# Patient Record
Sex: Female | Born: 1975 | Race: White | Hispanic: No | Marital: Single | Smoking: Current every day smoker
Health system: Southern US, Community
[De-identification: ages and names within clinical notes are randomized; demographics above are authoritative.]

## PROBLEM LIST (undated history)

## (undated) DIAGNOSIS — R569 Unspecified convulsions: Secondary | ICD-10-CM

---

## 2014-06-29 ENCOUNTER — Emergency Department (HOSPITAL_COMMUNITY): Payer: Self-pay

## 2014-06-29 ENCOUNTER — Encounter (HOSPITAL_COMMUNITY): Payer: Self-pay | Admitting: Emergency Medicine

## 2014-06-29 ENCOUNTER — Observation Stay (HOSPITAL_COMMUNITY)
Admission: EM | Admit: 2014-06-29 | Discharge: 2014-07-01 | Disposition: A | Discharge status: Home / Self Care | Payer: Self-pay | Attending: Internal Medicine | Admitting: Internal Medicine

## 2014-06-29 DIAGNOSIS — E8729 Other acidosis: Secondary | ICD-10-CM | POA: Diagnosis present

## 2014-06-29 DIAGNOSIS — E43 Unspecified severe protein-calorie malnutrition: Secondary | ICD-10-CM | POA: Insufficient documentation

## 2014-06-29 DIAGNOSIS — Z6828 Body mass index (BMI) 28.0-28.9, adult: Secondary | ICD-10-CM | POA: Insufficient documentation

## 2014-06-29 DIAGNOSIS — R1114 Bilious vomiting: Secondary | ICD-10-CM

## 2014-06-29 DIAGNOSIS — F17200 Nicotine dependence, unspecified, uncomplicated: Secondary | ICD-10-CM | POA: Insufficient documentation

## 2014-06-29 DIAGNOSIS — E872 Acidosis, unspecified: Principal | ICD-10-CM | POA: Insufficient documentation

## 2014-06-29 DIAGNOSIS — R112 Nausea with vomiting, unspecified: Secondary | ICD-10-CM | POA: Diagnosis present

## 2014-06-29 DIAGNOSIS — T522X1A Toxic effect of homologues of benzene, accidental (unintentional), initial encounter: Secondary | ICD-10-CM

## 2014-06-29 DIAGNOSIS — G40909 Epilepsy, unspecified, not intractable, without status epilepticus: Secondary | ICD-10-CM | POA: Diagnosis present

## 2014-06-29 HISTORY — DX: Unspecified convulsions: R56.9

## 2014-06-29 LAB — CBC WITH DIFFERENTIAL/PLATELET
Basophils Absolute: 0 10*3/uL (ref 0.0–0.1)
Basophils Relative: 0 % (ref 0–1)
Eosinophils Absolute: 0 10*3/uL (ref 0.0–0.7)
Eosinophils Relative: 0 % (ref 0–5)
HCT: 43 % (ref 36.0–46.0)
HEMOGLOBIN: 15 g/dL (ref 12.0–15.0)
Lymphocytes Relative: 29 % (ref 12–46)
Lymphs Abs: 2.6 10*3/uL (ref 0.7–4.0)
MCH: 29.9 pg (ref 26.0–34.0)
MCHC: 34.9 g/dL (ref 30.0–36.0)
MCV: 85.8 fL (ref 78.0–100.0)
MONOS PCT: 8 % (ref 3–12)
Monocytes Absolute: 0.7 10*3/uL (ref 0.1–1.0)
NEUTROS ABS: 5.6 10*3/uL (ref 1.7–7.7)
NEUTROS PCT: 63 % (ref 43–77)
Platelets: 308 10*3/uL (ref 150–400)
RBC: 5.01 MIL/uL (ref 3.87–5.11)
RDW: 13.7 % (ref 11.5–15.5)
WBC: 8.9 10*3/uL (ref 4.0–10.5)

## 2014-06-29 LAB — URINALYSIS, ROUTINE W REFLEX MICROSCOPIC
BILIRUBIN URINE: NEGATIVE
Glucose, UA: NEGATIVE mg/dL
Ketones, ur: NEGATIVE mg/dL
NITRITE: NEGATIVE
PH: 6 (ref 5.0–8.0)
Protein, ur: 30 mg/dL — AB
Specific Gravity, Urine: 1.024 (ref 1.005–1.030)
UROBILINOGEN UA: 0.2 mg/dL (ref 0.0–1.0)

## 2014-06-29 LAB — COMPREHENSIVE METABOLIC PANEL
ALK PHOS: 73 U/L (ref 39–117)
ALT: 14 U/L (ref 0–35)
ANION GAP: 19 — AB (ref 5–15)
AST: 12 U/L (ref 0–37)
Albumin: 4.3 g/dL (ref 3.5–5.2)
BUN: 7 mg/dL (ref 6–23)
CHLORIDE: 108 meq/L (ref 96–112)
CO2: 12 mEq/L — ABNORMAL LOW (ref 19–32)
Calcium: 9.4 mg/dL (ref 8.4–10.5)
Creatinine, Ser: 0.72 mg/dL (ref 0.50–1.10)
GFR calc non Af Amer: >90 mL/min (ref >90)
GLUCOSE: 121 mg/dL — AB (ref 70–99)
POTASSIUM: 3.9 meq/L (ref 3.7–5.3)
Sodium: 139 mEq/L (ref 137–147)
Total Protein: 8.3 g/dL (ref 6.0–8.3)

## 2014-06-29 LAB — I-STAT ARTERIAL BLOOD GAS, ED
ACID-BASE DEFICIT: 13 mmol/L — AB (ref 0.0–2.0)
Bicarbonate: 13.4 mEq/L — ABNORMAL LOW (ref 20.0–24.0)
O2 SAT: 96 %
PCO2 ART: 30.9 mmHg — AB (ref 35.0–45.0)
TCO2: 14 mmol/L (ref 0–100)
pH, Arterial: 7.246 — ABNORMAL LOW (ref 7.350–7.450)
pO2, Arterial: 96 mmHg (ref 80.0–100.0)

## 2014-06-29 LAB — LIPASE, BLOOD: Lipase: 34 U/L (ref 11–59)

## 2014-06-29 LAB — URINE MICROSCOPIC-ADD ON

## 2014-06-29 LAB — CBG MONITORING, ED: Glucose-Capillary: 104 mg/dL — ABNORMAL HIGH (ref 70–99)

## 2014-06-29 LAB — I-STAT CHEM 8, ED
BUN: 8 mg/dL (ref 6–23)
CALCIUM ION: 1.28 mmol/L — AB (ref 1.12–1.23)
CREATININE: 0.8 mg/dL (ref 0.50–1.10)
Chloride: 113 mEq/L — ABNORMAL HIGH (ref 96–112)
Glucose, Bld: 122 mg/dL — ABNORMAL HIGH (ref 70–99)
HCT: 49 % — ABNORMAL HIGH (ref 36.0–46.0)
HEMOGLOBIN: 16.7 g/dL — AB (ref 12.0–15.0)
Potassium: 3.5 mEq/L — ABNORMAL LOW (ref 3.7–5.3)
SODIUM: 137 meq/L (ref 137–147)
TCO2: 14 mmol/L (ref 0–100)

## 2014-06-29 LAB — POC URINE PREG, ED: Preg Test, Ur: NEGATIVE

## 2014-06-29 LAB — I-STAT CG4 LACTIC ACID, ED: Lactic Acid, Venous: 0.57 mmol/L (ref 0.5–2.2)

## 2014-06-29 MED ORDER — MORPHINE SULFATE 4 MG/ML IJ SOLN
4.0000 mg | Freq: Once | INTRAMUSCULAR | Status: AC
  Administered 2014-06-29: 4 mg via INTRAVENOUS
  Filled 2014-06-29: qty 1

## 2014-06-29 MED ORDER — PROMETHAZINE HCL 25 MG PO TABS: 25.0000 mg | ORAL_TABLET | Freq: Four times a day (QID) | ORAL | Status: AC | PRN

## 2014-06-29 MED ORDER — LORAZEPAM 2 MG/ML IJ SOLN
1.0000 mg | Freq: Once | INTRAMUSCULAR | Status: AC
  Administered 2014-06-29: 1 mg via INTRAVENOUS
  Filled 2014-06-29: qty 1

## 2014-06-29 MED ORDER — SODIUM CHLORIDE 0.9 % IV BOLUS (SEPSIS)
1000.0000 mL | Freq: Once | INTRAVENOUS | Status: AC
  Administered 2014-06-30: 1000 mL via INTRAVENOUS

## 2014-06-29 MED ORDER — ONDANSETRON 4 MG PO TBDP
8.0000 mg | ORAL_TABLET | Freq: Once | ORAL | Status: AC
  Administered 2014-06-29: 8 mg via ORAL
  Filled 2014-06-29: qty 2

## 2014-06-29 MED ORDER — CARBAMAZEPINE 200 MG PO TABS: 200.0000 mg | ORAL_TABLET | Freq: Two times a day (BID) | ORAL | Status: AC

## 2014-06-29 MED ORDER — IOHEXOL 300 MG/ML  SOLN
100.0000 mL | Freq: Once | INTRAMUSCULAR | Status: AC | PRN
  Administered 2014-06-29: 100 mL via INTRAVENOUS

## 2014-06-29 MED ORDER — DICYCLOMINE HCL 20 MG PO TABS: 20.0000 mg | ORAL_TABLET | Freq: Two times a day (BID) | ORAL | Status: AC

## 2014-06-29 MED ORDER — SODIUM CHLORIDE 0.9 % IV BOLUS (SEPSIS)
1000.0000 mL | Freq: Once | INTRAVENOUS | Status: AC
  Administered 2014-06-29: 1000 mL via INTRAVENOUS

## 2014-06-29 MED ORDER — ZOLPIDEM TARTRATE 5 MG PO TABS
5.0000 mg | ORAL_TABLET | Freq: Every evening | ORAL | Status: DC | PRN
  Administered 2014-06-30 (×2): 5 mg via ORAL
  Filled 2014-06-29 (×2): qty 1

## 2014-06-29 NOTE — ED Notes (Signed)
Pt in with family stating that patient has been having increased seizures over the last week, pt with history of seizures but does not normally have "violent ones", family reports patient has been seen at multiple hospitals local to them in the last few days but they are unhappy with results so they traveled here. Family also reports that patient has been forgetful recently also. Also c/o n/v recently. Pt has been off of her dilantin for the last few months due to lack of insurance, states she received an IV dose two nights ago but has had a seizure again since that time. Most recent seizure was last night. Pt c/o tongue pain and swelling from oral trauma earlier in the week. Pt alert and oriented during triage.

## 2014-06-29 NOTE — ED Notes (Signed)
MD at bedside. 

## 2014-06-29 NOTE — ED Notes (Signed)
CT notified pt done with contrast. 

## 2014-06-29 NOTE — Discharge Instructions (Signed)
Please follow up as soon as possible with the clinic in [facility]. You will need to have your tegretol level and you sodium level checked. Please go to the health department or to the ED if you are unable to access care.  Abdominal (belly) pain can be caused by many things. Your caregiver performed an examination and possibly ordered blood/urine tests and imaging (CT scan, x-rays, ultrasound). Many cases can be observed and treated at home after initial evaluation in the emergency department. Even though you are being discharged home, abdominal pain can be unpredictable. Therefore, you need a repeated exam if your pain does not resolve, returns, or worsens. Most patients with abdominal pain don't have to be admitted to the hospital or have surgery, but serious problems like appendicitis and gallbladder attacks can start out as nonspecific pain. Many abdominal conditions cannot be diagnosed in one visit, so follow-up evaluations are very important. SEEK IMMEDIATE MEDICAL ATTENTION IF: The pain does not go away or becomes severe.  A temperature above 101 develops.  Repeated vomiting occurs (multiple episodes).  The pain becomes localized to portions of the abdomen. The right side could possibly be appendicitis. In an adult, the left lower portion of the abdomen could be colitis or diverticulitis.  Blood is being passed in stools or vomit (bright red or black tarry stools).  Return also if you develop chest pain, difficulty breathing, dizziness or fainting, or become confused, poorly responsive, or inconsolable (young children).   Driving and Equipment Restrictions Some medical problems make it dangerous to drive, ride a bike, or use machines. Some of these problems are:  A hard blow to the head (concussion).  Passing out (fainting).  Twitching and shaking (seizures).  Low blood sugar.  Taking medicine to help you relax (sedatives).  Taking pain medicines.  Wearing an eye  patch.  Wearing splints. This can make it hard to use parts of your body that you need to drive safely. HOME CARE   Do not drive until your doctor says it is okay.  Do not use machines until your doctor says it is okay. You may need a form signed by your doctor (medical release) before you can drive again. You may also need this form before you do other tasks where you need to be fully alert. MAKE SURE YOU:  Understand these instructions.  Will watch your condition.  Will get help right away if you are not doing well or get worse. Document Released: 01/01/2005 Document Revised: 02/16/2012 Document Reviewed: 04/03/2010 [facility] Patient Information 2015 [place], [place]. This information is not intended to replace advice given to you by your health care provider. Make sure you discuss any questions you have with your health care provider.  Epilepsy Epilepsy is a disorder in which a person has repeated seizures over time. A seizure is a release of abnormal electrical activity in the brain. Seizures can cause a change in attention, behavior, or the ability to remain awake and alert (altered mental status). Seizures often involve uncontrollable shaking (convulsions).  Most people with epilepsy lead normal lives. However, people with epilepsy are at an increased risk of falls, accidents, and injuries. Therefore, it is important to begin treatment right away. CAUSES  Epilepsy has many possible causes. Anything that disturbs the normal pattern of brain cell activity can lead to seizures. This may include:   Head injury.  Birth trauma.  High fever as a child.  Stroke.  Bleeding into or around the brain.  Certain drugs.  Prolonged low oxygen, such as what occurs after CPR efforts.  Abnormal brain development.  Certain illnesses, such as meningitis, encephalitis (brain infection), malaria, and other infections.  An imbalance of nerve signaling chemicals (neurotransmitters).  SIGNS  AND SYMPTOMS  The symptoms of a seizure can vary greatly from one person to another. Right before a seizure, you may have a warning (aura) that a seizure is about to occur. An aura may include the following symptoms:  Fear or anxiety.  Nausea.  Feeling like the room is spinning (vertigo).  Vision changes, such as seeing flashing lights or spots. Common symptoms during a seizure include:  Abnormal sensations, such as an abnormal smell or a bitter taste in the mouth.   Sudden, general body stiffness.   Convulsions that involve rhythmic jerking of the face, arm, or leg on one or both sides.   Sudden change in consciousness.   Appearing to be awake but not responding.   Appearing to be asleep but cannot be awakened.   Grimacing, chewing, lip smacking, drooling, tongue biting, or loss of bowel or bladder control. After a seizure, you may feel sleepy for a while. DIAGNOSIS  Your health care provider will ask about your symptoms and take a medical history. Descriptions from any witnesses to your seizures will be very helpful in the diagnosis. A physical exam, including a detailed neurological exam, is necessary. Various tests may be done, such as:   An electroencephalogram (EEG). This is a painless test of your brain waves. In this test, a diagram is created of your brain waves. These diagrams can be interpreted by a specialist.  An MRI of the brain.   A CT scan of the brain.   A spinal tap (lumbar puncture, LP).  Blood tests to check for signs of infection or abnormal blood chemistry. TREATMENT  There is no cure for epilepsy, but it is generally treatable. Once epilepsy is diagnosed, it is important to begin treatment as soon as possible. For most people with epilepsy, seizures can be controlled with medicines. The following may also be used:  A pacemaker for the brain (vagus nerve stimulator) can be used for people with seizures that are not well controlled by  medicine.  Surgery on the brain. For some people, epilepsy eventually goes away. HOME CARE INSTRUCTIONS   Follow your health care provider's recommendations on driving and safety in normal activities.  Get enough rest. Lack of sleep can cause seizures.  Only take over-the-counter or prescription medicines as directed by your health care provider. Take any prescribed medicine exactly as directed.  Avoid any known triggers of your seizures.  Keep a seizure diary. Record what you recall about any seizure, especially any possible trigger.   Make sure the people you live and work with know that you are prone to seizures. They should receive instructions on how to help you. In general, a witness to a seizure should:   Cushion your head and body.   Turn you on your side.   Avoid unnecessarily restraining you.   Not place anything inside your mouth.   Call for emergency medical help if there is any question about what has occurred.   Follow up with your health care provider as directed. You may need regular blood tests to monitor the levels of your medicine.  SEEK MEDICAL CARE IF:   You develop signs of infection or other illness. This might increase the risk of a seizure.   You seem to be having more  frequent seizures.   Your seizure pattern is changing.  SEEK IMMEDIATE MEDICAL CARE IF:   You have a seizure that does not stop after a few moments.   You have a seizure that causes any difficulty in breathing.   You have a seizure that results in a very severe headache.   You have a seizure that leaves you with the inability to speak or use a part of your body.  Document Released: 11/24/2005 Document Revised: 09/14/2013 Document Reviewed: 07/06/2013 [facility] Patient Information 2015 [place], [place]. This information is not intended to replace advice given to you by your health care provider. Make sure you discuss any questions you have with your health care  provider.  Nausea and Vomiting Nausea is a sick feeling that often comes before throwing up (vomiting). Vomiting is a reflex where stomach contents come out of your mouth. Vomiting can cause severe loss of body fluids (dehydration). Children and elderly adults can become dehydrated quickly, especially if they also have diarrhea. Nausea and vomiting are symptoms of a condition or disease. It is important to find the cause of your symptoms. CAUSES   Direct irritation of the stomach lining. This irritation can result from increased acid production (gastroesophageal reflux disease), infection, food poisoning, taking certain medicines (such as nonsteroidal anti-inflammatory drugs), alcohol use, or tobacco use.  Signals from the brain.These signals could be caused by a headache, heat exposure, an inner ear disturbance, increased pressure in the brain from injury, infection, a tumor, or a concussion, pain, emotional stimulus, or metabolic problems.  An obstruction in the gastrointestinal tract (bowel obstruction).  Illnesses such as diabetes, hepatitis, gallbladder problems, appendicitis, kidney problems, cancer, sepsis, atypical symptoms of a heart attack, or eating disorders.  Medical treatments such as chemotherapy and radiation.  Receiving medicine that makes you sleep (general anesthetic) during surgery. DIAGNOSIS Your caregiver may ask for tests to be done if the problems do not improve after a few days. Tests may also be done if symptoms are severe or if the reason for the nausea and vomiting is not clear. Tests may include:  Urine tests.  Blood tests.  Stool tests.  Cultures (to look for evidence of infection).  X-rays or other imaging studies. Test results can help your caregiver make decisions about treatment or the need for additional tests. TREATMENT You need to stay well hydrated. Drink frequently but in small amounts.You may wish to drink water, sports drinks, clear broth, or  eat frozen ice pops or gelatin dessert to help stay hydrated.When you eat, eating slowly may help prevent nausea.There are also some antinausea medicines that may help prevent nausea. HOME CARE INSTRUCTIONS   Take all medicine as directed by your caregiver.  If you do not have an appetite, do not force yourself to eat. However, you must continue to drink fluids.  If you have an appetite, eat a normal diet unless your caregiver tells you differently.  Eat a variety of complex carbohydrates (rice, wheat, potatoes, bread), lean meats, yogurt, fruits, and vegetables.  Avoid high-fat foods because they are more difficult to digest.  Drink enough water and fluids to keep your urine clear or pale yellow.  If you are dehydrated, ask your caregiver for specific rehydration instructions. Signs of dehydration may include:  Severe thirst.  Dry lips and mouth.  Dizziness.  Dark urine.  Decreasing urine frequency and amount.  Confusion.  Rapid breathing or pulse. SEEK IMMEDIATE MEDICAL CARE IF:   You have blood or brown flecks (like  coffee grounds) in your vomit.  You have black or bloody stools.  You have a severe headache or stiff neck.  You are confused.  You have severe abdominal pain.  You have chest pain or trouble breathing.  You do not urinate at least once every 8 hours.  You develop cold or clammy skin.  You continue to vomit for longer than 24 to 48 hours.  You have a fever. MAKE SURE YOU:   Understand these instructions.  Will watch your condition.  Will get help right away if you are not doing well or get worse. Document Released: 11/24/2005 Document Revised: 02/16/2012 Document Reviewed: 04/23/2011 [facility] Patient Information 2015 [place], [place]. This information is not intended to replace advice given to you by your health care provider. Make sure you discuss any questions you have with your health care provider.

## 2014-06-29 NOTE — ED Provider Notes (Signed)
CSN: [id number]     Arrival date & time 06/29/14  1616 History   First MD Initiated Contact with Patient 06/29/14 1621     Chief Complaint  Patient presents with  . Seizures     (Consider location/radiation/quality/duration/timing/severity/associated sxs/prior Treatment) HPI April Lowe Is a 38 year old female with a past medical history of epilepsy who presents to the emergency department with multiple complaints. Patient was seen 2 days ago the hospital and mortality. She states she was not pleased with her care there. The patient states that she has been on Dilantin for her seizures and that she has no neurologic outpatient followup. She is always got her medications from the hospital. She is unable to afford Dilantin at this time and has not been taking any of her medications. She has had worsening grand mal seizures that have increased in both intensity, duration, and frequency. The patient is frustrated because she is unable to obtain the medications. She is also concerned because she is a younger sister with severe epilepsy going to have some sort of brain surgery secondary to her worsening epileptic seizures. The patient also complains of abdominal pain, nausea and vomiting for the past 2 weeks has been unable to hold down any significant amount of fluid. She cannot eat solid foods. She has been vomiting his bright green vomitus as well as bright creams as obliquity. The patient has had what she thinks is a 15 pound weight loss in the past 2 weeks and has had to move her wedding ring to her middle finger because of her weight loss. She has a complaint of urgency to urinate, frequency of urination, some dysuria as well as lower quadrant abdominal pain on the left side. Denies fevers, chills, myalgias, arthralgias. Denies DOE, SOB, chest tightness or pressure, radiation to left arm, jaw or back, or diaphoresis.   Past Medical History  Diagnosis Date  . Seizures    History reviewed. No  pertinent past surgical history. History reviewed. No pertinent family history. History  Substance Use Topics  . Smoking status: Current Every Day Smoker  . Smokeless tobacco: Not on file  . Alcohol Use: Not on file   OB History   Grav Para Term Preterm Abortions TAB SAB Ect Mult Living                 Review of Systems  Ten systems reviewed and are negative for acute change, except as noted in the HPI.    Allergies  Doxycycline; Toradol; and Penicillins  Home Medications   Prior to Admission medications   Medication Sig Start Date End Date Taking? Authorizing Provider  phenytoin (DILANTIN) 100 MG ER capsule Take 100 mg by mouth 3 (three) times daily.   Yes Historical Provider, MD   BP 131/83  Pulse 100  Temp(Src) 97.4 F (36.3 C) (Oral)  Resp 23  SpO2 99% Physical Exam  Nursing note and vitals reviewed. Constitutional: She is oriented to person, place, and time.  Pale, dry oral mucosa, deep cracked angular chelitis  HENT:  Head: Normocephalic and atraumatic.  Bite marks present left lateral tongue  Eyes: Conjunctivae and EOM are normal. Pupils are equal, round, and reactive to light.  Neck: Normal range of motion.  Cardiovascular: Normal rate and regular rhythm.   Pulmonary/Chest: Effort normal and breath sounds normal.  Acetone smell to breath  Abdominal: Soft. Bowel sounds are normal. She exhibits no distension. There is tenderness (BL lower quadrants. worse on the left).  Neurological: She is  alert and oriented to person, place, and time.  Skin: Skin is warm.    ED Course  Procedures (including critical care time) Labs Review Labs Reviewed  CLOSTRIDIUM DIFFICILE BY PCR  CBC WITH DIFFERENTIAL  COMPREHENSIVE METABOLIC PANEL  URINALYSIS, ROUTINE W REFLEX MICROSCOPIC  LIPASE, BLOOD  CBG MONITORING, ED  I-STAT CG4 LACTIC ACID, ED    Imaging Review No results found.   EKG Interpretation None      MDM   Final diagnoses:  None   6:04 PM BP  131/83  Pulse 100  Temp(Src) 97.4 F (36.3 C) (Oral)  Resp 23  SpO2 99% Patient here with multiple complaints. She is borderline tachycardic, pressure is normal without a fever. The patient appears to be malnourished with deep angular colitis. Question if this is due to her reported vomiting versus some other kind of folate and B12 deficiency. I suspect that her seizures are due to new lack of ability to afford her medications. As discussed our workup here in the emergency department include abdominal pain. Was consult within the posterior to see about starting on on an affordable form of fever meds.  Patient / Family / Caregiver understand and agree with initial ED impression and plan with expectations set for ED visit.     8:56 PM Patient awaiting CT scan. I have spoken with Dr. Amada JupiterKirkpatrick who advises tegretol 200 mg bid, however will need close monitoring of her levels and serum sodium. I have discussed this with the patient.\ No leukocytosis, she does have metabolic acidosis and low serum bicarb, I question if this si form her diarrhea and vomiting. Patient is currently being rehydrated.  [place] has [place] an [place].  i have givent report to PA Dammen who will assume care.  Arthor CaptainAbigail Edi Gorniak, PA-C 06/30/14 1104

## 2014-06-29 NOTE — ED Notes (Signed)
Pt finished with contrast.

## 2014-06-30 ENCOUNTER — Encounter (HOSPITAL_COMMUNITY): Payer: Self-pay | Admitting: Emergency Medicine

## 2014-06-30 DIAGNOSIS — E8729 Other acidosis: Secondary | ICD-10-CM | POA: Diagnosis present

## 2014-06-30 DIAGNOSIS — T522X1A Toxic effect of homologues of benzene, accidental (unintentional), initial encounter: Secondary | ICD-10-CM

## 2014-06-30 DIAGNOSIS — E872 Acidosis, unspecified: Secondary | ICD-10-CM

## 2014-06-30 DIAGNOSIS — E43 Unspecified severe protein-calorie malnutrition: Secondary | ICD-10-CM | POA: Insufficient documentation

## 2014-06-30 DIAGNOSIS — R112 Nausea with vomiting, unspecified: Secondary | ICD-10-CM | POA: Diagnosis present

## 2014-06-30 DIAGNOSIS — R1114 Bilious vomiting: Secondary | ICD-10-CM

## 2014-06-30 DIAGNOSIS — G40909 Epilepsy, unspecified, not intractable, without status epilepticus: Secondary | ICD-10-CM

## 2014-06-30 DIAGNOSIS — T5291XA Toxic effect of unspecified organic solvent, accidental (unintentional), initial encounter: Secondary | ICD-10-CM

## 2014-06-30 DIAGNOSIS — T522X4A Toxic effect of homologues of benzene, undetermined, initial encounter: Secondary | ICD-10-CM

## 2014-06-30 LAB — BASIC METABOLIC PANEL
Anion gap: 14 (ref 5–15)
BUN: 10 mg/dL (ref 6–23)
CO2: 14 mEq/L — ABNORMAL LOW (ref 19–32)
CREATININE: 0.69 mg/dL (ref 0.50–1.10)
Calcium: 8.4 mg/dL (ref 8.4–10.5)
Chloride: 113 mEq/L — ABNORMAL HIGH (ref 96–112)
GFR calc Af Amer: >90 mL/min (ref >90)
GLUCOSE: 101 mg/dL — AB (ref 70–99)
POTASSIUM: 3.9 meq/L (ref 3.7–5.3)
Sodium: 141 mEq/L (ref 137–147)

## 2014-06-30 LAB — SALICYLATE LEVEL: Salicylate Lvl: <2 mg/dL — ABNORMAL LOW (ref 2.8–20.0)

## 2014-06-30 LAB — CK: Total CK: 104 U/L (ref 7–177)

## 2014-06-30 MED ORDER — DICYCLOMINE HCL 10 MG PO CAPS
10.0000 mg | ORAL_CAPSULE | Freq: Three times a day (TID) | ORAL | Status: DC | PRN
  Administered 2014-06-30 – 2014-07-01 (×2): 10 mg via ORAL
  Filled 2014-06-30 (×4): qty 1

## 2014-06-30 MED ORDER — OXYCODONE HCL 5 MG PO TABS
5.0000 mg | ORAL_TABLET | Freq: Once | ORAL | Status: AC
  Administered 2014-06-30: 5 mg via ORAL
  Filled 2014-06-30: qty 1

## 2014-06-30 MED ORDER — LEVOFLOXACIN 500 MG PO TABS: 500.0000 mg | ORAL_TABLET | Freq: Every day | ORAL | Status: DC

## 2014-06-30 MED ORDER — KETOROLAC TROMETHAMINE 10 MG PO TABS: 10.0000 mg | ORAL_TABLET | Freq: Four times a day (QID) | ORAL | Status: DC | PRN

## 2014-06-30 MED ORDER — ACETAMINOPHEN 500 MG PO TABS
500.0000 mg | ORAL_TABLET | Freq: Four times a day (QID) | ORAL | Status: DC | PRN
  Administered 2014-06-30 – 2014-07-01 (×2): 500 mg via ORAL
  Filled 2014-06-30 (×2): qty 1

## 2014-06-30 MED ORDER — HYDROXYZINE HCL 25 MG PO TABS
25.0000 mg | ORAL_TABLET | ORAL | Status: DC | PRN
  Administered 2014-06-30 – 2014-07-01 (×6): 25 mg via ORAL
  Filled 2014-06-30 (×6): qty 1

## 2014-06-30 MED ORDER — ONDANSETRON HCL 4 MG/2ML IJ SOLN
4.0000 mg | Freq: Once | INTRAMUSCULAR | Status: AC
  Administered 2014-06-30: 4 mg via INTRAVENOUS
  Filled 2014-06-30: qty 2

## 2014-06-30 MED ORDER — LEVOFLOXACIN 750 MG PO TABS
750.0000 mg | ORAL_TABLET | ORAL | Status: DC
  Administered 2014-06-30 – 2014-07-01 (×2): 750 mg via ORAL
  Filled 2014-06-30 (×4): qty 1

## 2014-06-30 MED ORDER — NICOTINE POLACRILEX 2 MG MT GUM
2.0000 mg | CHEWING_GUM | OROMUCOSAL | Status: DC | PRN
  Administered 2014-06-30 – 2014-07-01 (×3): 2 mg via ORAL
  Filled 2014-06-30 (×4): qty 1

## 2014-06-30 MED ORDER — IBUPROFEN 800 MG PO TABS
800.0000 mg | ORAL_TABLET | Freq: Three times a day (TID) | ORAL | Status: DC | PRN
  Administered 2014-06-30: 800 mg via ORAL
  Filled 2014-06-30: qty 1

## 2014-06-30 MED ORDER — OXYCODONE-ACETAMINOPHEN 5-325 MG PO TABS
1.0000 | ORAL_TABLET | Freq: Once | ORAL | Status: AC
  Administered 2014-06-30: 1 via ORAL
  Filled 2014-06-30: qty 1

## 2014-06-30 MED ORDER — ONDANSETRON HCL 4 MG/2ML IJ SOLN
4.0000 mg | Freq: Four times a day (QID) | INTRAMUSCULAR | Status: DC | PRN
Start: 2014-06-30 — End: 2014-07-01
  Administered 2014-06-30 – 2014-07-01 (×2): 4 mg via INTRAVENOUS
  Filled 2014-06-30 (×2): qty 2

## 2014-06-30 MED ORDER — ENSURE COMPLETE PO LIQD
237.0000 mL | Freq: Two times a day (BID) | ORAL | Status: DC
  Administered 2014-06-30 – 2014-07-01 (×2): 237 mL via ORAL

## 2014-06-30 MED ORDER — DEXTROSE 5 % IV SOLN
INTRAVENOUS | Status: DC
  Administered 2014-06-30: 09:00:00 via INTRAVENOUS

## 2014-06-30 MED ORDER — PNEUMOCOCCAL VAC POLYVALENT 25 MCG/0.5ML IJ INJ
0.5000 mL | INJECTION | INTRAMUSCULAR | Status: DC
  Filled 2014-06-30: qty 0.5

## 2014-06-30 MED ORDER — HEPARIN SODIUM (PORCINE) 5000 UNIT/ML IJ SOLN
5000.0000 [IU] | Freq: Three times a day (TID) | INTRAMUSCULAR | Status: DC
  Administered 2014-06-30 – 2014-07-01 (×4): 5000 [IU] via SUBCUTANEOUS
  Filled 2014-06-30 (×6): qty 1

## 2014-06-30 MED ORDER — CARBAMAZEPINE ER 200 MG PO TB12
200.0000 mg | ORAL_TABLET | Freq: Two times a day (BID) | ORAL | Status: DC
  Administered 2014-06-30 – 2014-07-01 (×4): 200 mg via ORAL
  Filled 2014-06-30 (×5): qty 1

## 2014-06-30 MED ORDER — SODIUM CHLORIDE 0.9 % IV SOLN
INTRAVENOUS | Status: DC
  Administered 2014-06-30: 04:00:00 via INTRAVENOUS

## 2014-06-30 MED ORDER — FLUCONAZOLE 150 MG PO TABS
150.0000 mg | ORAL_TABLET | Freq: Once | ORAL | Status: AC
  Administered 2014-06-30: 150 mg via ORAL
  Filled 2014-06-30: qty 1

## 2014-06-30 MED ORDER — SODIUM CHLORIDE 0.45 % IV SOLN
INTRAVENOUS | Status: DC
  Administered 2014-06-30 (×2): via INTRAVENOUS

## 2014-06-30 NOTE — Progress Notes (Signed)
RN attempted to call. Will try again

## 2014-06-30 NOTE — Progress Notes (Addendum)
[name] TEAM 1 - Stepdown/ICU TEAM Progress Note  April MillardLoretta Lowe WUJ:[id number] DOB: 1976-11-16 DOA: 06/29/2014 PCP: No PCP Per Patient  Admit HPI / Brief Narrative: 38 yo F w/ hx of epilepsy who presented to the ED with multiple complaints. First she reported she had suffered seizures but could not afford her dilantin. Neurology was called and recommended that the patient be placed on tegratol instead.  Second the patient reported abdominal pain with greenish vomit for 2 weeks. Thid had been worsening and particularly severe the past couple of days prior to her presentation.  Work up in the ED demonstrated an anion gap metabolic acidosis.   HPI/Subjective: Pt seen for a f/u visit.    Assessment/Plan:  High anion gap metabolic acidosis Gap now close to closed - recheck in AM   Abdom pain w/ reported vomiting  CT abdom and pelvis w/o acute findings - lipase and LFTs are normal - avoid narcotics due to possible hx of narcotic abuse and no objective evidence at present to justify high dose pain medication use (discussed w/ pt)  UTI UA c/w UTI and yeast noted on micro   Seizure d/c Avoid benzos due to possible hx of abuse, as well as fear of withdrawal seziure - likewise, avoid narcotics due to concern that abuse and then withdrawal at home could precipitate seizure   Severe malnutrition in the context of acute illness or injury   Code Status: FULL Family Communication: spoke w/ pt and partner at bedside  Disposition Plan: probable d/c home in AM if labs stable   Consultants: Neuro - per telephone by EDP  Procedures: none  Antibiotics: Levaquin 7/24 >>  DVT prophylaxis: SQ heparin   Objective: Blood pressure 102/65, pulse 79, temperature 98.4 F (36.9 C), temperature source Oral, resp. rate 18, height 5\' 7"  (1.702 m), weight 82.3 kg (181 lb 7 oz), last menstrual period 06/16/2014, SpO2 97.00%. No intake or output data in the 24 hours ending 06/30/14 1131  Exam: F/U  exam completed   Data Reviewed: Basic Metabolic Panel:  Recent Labs Lab 06/29/14 1750 06/29/14 2331 06/30/14 0522  NA 139 137 141  K 3.9 3.5* 3.9  CL 108 113* 113*  CO2 12*  --  14*  GLUCOSE 121* 122* 101*  BUN 7 8 10   CREATININE 0.72 0.80 0.69  CALCIUM 9.4  --  8.4    Liver Function Tests:  Recent Labs Lab 06/29/14 1750  AST 12  ALT 14  ALKPHOS 73  BILITOT <0.2*  PROT 8.3  ALBUMIN 4.3    Recent Labs Lab 06/29/14 1750  LIPASE 34   Coags: No results found for this basename: PT, INR,  in the last 168 hours No results found for this basename: PTT,  in the last 168 hours  CBC:  Recent Labs Lab 06/29/14 1750 06/29/14 2331  WBC 8.9  --   NEUTROABS 5.6  --   HGB 15.0 16.7*  HCT 43.0 49.0*  MCV 85.8  --   PLT 308  --     Cardiac Enzymes:  Recent Labs Lab 06/29/14 2340  CKTOTAL 104   CBG:  Recent Labs Lab 06/29/14 1743  GLUCAP 104*   Studies:  Recent x-ray studies have been reviewed in detail by the Attending Physician  Scheduled Meds:  Scheduled Meds: . carbamazepine  200 mg Oral BID  . heparin  5,000 Units Subcutaneous 3 times per day  . [START ON 07/01/2014] pneumococcal 23 valent vaccine  0.5 mL Intramuscular Tomorrow-1000  Time spent on care of this patient: 25+ mins   Lonia Blood , MD   [facility] Office  [phone] Pager - Text Page per Amion as per below:  On-Call/Text Page:      [email or web address]      password [email or web address]  If 7PM-7AM, please contact night-coverage [email or web address] Password [facility] 06/30/2014, 11:31 AM   LOS: 1 day

## 2014-06-30 NOTE — ED Provider Notes (Signed)
April Lowe S 8:00 PM patient discussed in sign out. Patient with history of seizure disorder presenting with multiple symptoms including a seizure yesterday diffuse body aches and abdominal pains. Patient has been unable to afford Dilantin which she has been prescribed in the past. She was seen at [facility] given Dilantin loading dose and discharged several days ago but has continued to have seizures. Last seizure was yesterday. Patient is pending a CT scan to evaluate abdominal pains. Suspect this will likely be unremarkable and patient may be discharged home. Neurology was consult it and recommend using Tegretol.   10:45 PM CT scan results do not show any acute findings. On review of patient laboratory tests she has significantly low bicarbonate level and elevated anion gap. Will order additional testing to evaluate for cause.   11:30PM patient was reevaluated. She is still having some abdominal pains as well as general muscle and body pains. This has been present for the past day following her seizure. She has not had any additional seizures recently. Denies any drugs or medication use at home. Denies any alcohol use. Denies drinking any other beverages such as ethylene glycol.  Pt discussed with Attending Physician. Repeat lab testing continues to show low bicarb level without a known cause at this time. Will plan to admit for obs.  12:40AM spoke with Dr. Julian Reil with [facility]. He will see patient and evaluate.    Results for orders placed during the hospital encounter of 06/29/14  CBC WITH DIFFERENTIAL      Result Value Ref Range   WBC 8.9  4.0 - 10.5 K/uL   RBC 5.01  3.87 - 5.11 MIL/uL   Hemoglobin 15.0  12.0 - 15.0 g/dL   HCT 16.1  09.6 - 04.5 %   MCV 85.8  78.0 - 100.0 fL   MCH 29.9  26.0 - 34.0 pg   MCHC 34.9  30.0 - 36.0 g/dL   RDW 40.9  81.1 - 91.4 %   Platelets 308  150 - 400 K/uL   Neutrophils Relative % 63  43 - 77 %   Neutro Abs 5.6  1.7 - 7.7 K/uL   Lymphocytes Relative 29  12 - 46 %   Lymphs Abs 2.6  0.7 - 4.0 K/uL   Monocytes Relative 8  3 - 12 %   Monocytes Absolute 0.7  0.1 - 1.0 K/uL   Eosinophils Relative 0  0 - 5 %   Eosinophils Absolute 0.0  0.0 - 0.7 K/uL   Basophils Relative 0  0 - 1 %   Basophils Absolute 0.0  0.0 - 0.1 K/uL  COMPREHENSIVE METABOLIC PANEL      Result Value Ref Range   Sodium 139  137 - 147 mEq/L   Potassium 3.9  3.7 - 5.3 mEq/L   Chloride 108  96 - 112 mEq/L   CO2 12 (*) 19 - 32 mEq/L   Glucose, Bld 121 (*) 70 - 99 mg/dL   BUN 7  6 - 23 mg/dL   Creatinine, Ser 7.82  0.50 - 1.10 mg/dL   Calcium 9.4  8.4 - 95.6 mg/dL   Total Protein 8.3  6.0 - 8.3 g/dL   Albumin 4.3  3.5 - 5.2 g/dL   AST 12  0 - 37 U/L   ALT 14  0 - 35 U/L   Alkaline Phosphatase 73  39 - 117 U/L   Total Bilirubin <0.2 (*) 0.3 - 1.2 mg/dL   GFR calc non Af Amer >90  >  90 mL/min   GFR calc Af Amer >90  >90 mL/min   Anion gap 19 (*) 5 - 15  URINALYSIS, ROUTINE W REFLEX MICROSCOPIC      Result Value Ref Range   Color, Urine YELLOW  YELLOW   APPearance CLEAR  CLEAR   Specific Gravity, Urine 1.024  1.005 - 1.030   pH 6.0  5.0 - 8.0   Glucose, UA NEGATIVE  NEGATIVE mg/dL   Hgb urine dipstick MODERATE (*) NEGATIVE   Bilirubin Urine NEGATIVE  NEGATIVE   Ketones, ur NEGATIVE  NEGATIVE mg/dL   Protein, ur 30 (*) NEGATIVE mg/dL   Urobilinogen, UA 0.2  0.0 - 1.0 mg/dL   Nitrite NEGATIVE  NEGATIVE   Leukocytes, UA SMALL (*) NEGATIVE  LIPASE, BLOOD      Result Value Ref Range   Lipase 34  11 - 59 U/L  URINE MICROSCOPIC-ADD ON      Result Value Ref Range   Squamous Epithelial / LPF RARE  RARE   WBC, UA 3-6  <3 WBC/hpf   RBC / HPF 3-6  <3 RBC/hpf   Bacteria, UA RARE  RARE   Urine-Other RARE YEAST    CK      Result Value Ref Range   Total CK 104  7 - 177 U/L  CBG MONITORING, ED      Result Value Ref Range   Glucose-Capillary 104 (*) 70 - 99 mg/dL  I-STAT CG4 LACTIC ACID, ED      Result Value Ref Range   Lactic Acid, Venous 0.57   0.5 - 2.2 mmol/L  POC URINE PREG, ED      Result Value Ref Range   Preg Test, Ur NEGATIVE  NEGATIVE  I-STAT CHEM 8, ED      Result Value Ref Range   Sodium 137  137 - 147 mEq/L   Potassium 3.5 (*) 3.7 - 5.3 mEq/L   Chloride 113 (*) 96 - 112 mEq/L   BUN 8  6 - 23 mg/dL   Creatinine, Ser 1.610.80  0.50 - 1.10 mg/dL   Glucose, Bld 096122 (*) 70 - 99 mg/dL   Calcium, Ion 0.451.28 (*) 1.12 - 1.23 mmol/L   TCO2 14  0 - 100 mmol/L   Hemoglobin 16.7 (*) 12.0 - 15.0 g/dL   HCT 40.949.0 (*) 81.136.0 - 91.446.0 %  I-STAT ARTERIAL BLOOD GAS, ED      Result Value Ref Range   pH, Arterial 7.246 (*) 7.350 - 7.450   pCO2 arterial 30.9 (*) 35.0 - 45.0 mmHg   pO2, Arterial 96.0  80.0 - 100.0 mmHg   Bicarbonate 13.4 (*) 20.0 - 24.0 mEq/L   TCO2 14  0 - 100 mmol/L   O2 Saturation 96.0     Acid-base deficit 13.0 (*) 0.0 - 2.0 mmol/L   Patient temperature 98.6 F     Collection site RADIAL, ALLEN'S TEST ACCEPTABLE     Drawn by RT     Sample type ARTERIAL       Angus SellerPeter S Victory Strollo, PA-C 06/30/14 [phone]

## 2014-06-30 NOTE — ED Notes (Signed)
CALL REPORT TO THE FLOOR

## 2014-06-30 NOTE — ED Provider Notes (Signed)
Medical screening examination/treatment/procedure(s) were performed by non-physician practitioner and as supervising physician I was immediately available for consultation/collaboration.   EKG Interpretation None        Audree CamelScott T Selby Foisy, MD 06/30/14 2211

## 2014-06-30 NOTE — Progress Notes (Addendum)
INITIAL NUTRITION ASSESSMENT  DOCUMENTATION CODES Per approved criteria  -Severe malnutrition in the context of acute illness or injury   INTERVENTION: Ensure Complete po BID, each supplement provides 350 kcal and 13 grams of protein RD to follow for nutrition care plan  NUTRITION DIAGNOSIS: Unintended weight loss related to inadequate oral intake related to 6% weight loss x 2 weeks  Goal: Pt to meet >/= 90% of their estimated nutrition needs   Monitor:  PO & supplemental intake, weight, labs, I/O's  Reason for Assessment: Malnutrition Screening Tool Report  38 y.o. female  Admitting Dx: High anion gap metabolic acidosis  ASSESSMENT: 38 y.o. female with PMH of epilepsy who presents to the ED with multiple complaints. First she has had seizures but cannot afford her dilantin and this has resulted in worsening grand-mal seizures. Neurology was called and recommended that the patient be placed on tegratol instead at this time.  Pt reports her appetite has been very poor for the past 2 weeks given her seizures; states she's just been consuming some chips and Gatorade; + N/V; also endorses an approximate 11 lb weight loss during this time frame; would benefit from addition of oral nutrition supplements; pt amenable to Ensure Complete; RD to order.  Nutrition Focused Physical Exam:  Subcutaneous Fat:  Orbital Region: mild to moderate depletion Upper Arm Region: mild to moderate depletion Thoracic and Lumbar Region: N/A  Muscle:  Temple Region: mild to moderate depletion Clavicle Bone Region: WNL Clavicle and Acromion Bone Region: WNL Scapular Bone Region: N/A Dorsal Hand: N/A Patellar Region: N/A Anterior Thigh Region: N/A Posterior Calf Region: N/A  Edema: none  Patient meets criteria for severe malnutrition in the context of acute illness or injury as evidenced by < 50% intake of estimated energy requirement for > 5 days and 6% weight loss x 2 weeks.  Height: Ht  Readings from Last 1 Encounters:  06/30/14 5\' 7"  (1.702 m)    Weight: Wt Readings from Last 1 Encounters:  06/30/14 181 lb 7 oz (82.3 kg)    Ideal Body Weight: 135 lb  % Ideal Body Weight: 134%  Wt Readings from Last 10 Encounters:  06/30/14 181 lb 7 oz (82.3 kg)    Usual Body Weight: 192 lb  % Usual Body Weight: 94%  BMI:  Body mass index is 28.41 kg/(m^2).  Estimated Nutritional Needs: Kcal: 2000-2200 Protein: 100-110 gm Fluid: 2.0-2.2 L  Skin: Intact  Diet Order: General  EDUCATION NEEDS: -No education needs identified at this time  Labs:   Recent Labs Lab 06/29/14 1750 06/29/14 2331 06/30/14 0522  NA 139 137 141  K 3.9 3.5* 3.9  CL 108 113* 113*  CO2 12*  --  14*  BUN 7 8 10   CREATININE 0.72 0.80 0.69  CALCIUM 9.4  --  8.4  GLUCOSE 121* 122* 101*    CBG (last 3)   Recent Labs  06/29/14 1743  GLUCAP 104*    Scheduled Meds: . carbamazepine  200 mg Oral BID  . heparin  5,000 Units Subcutaneous 3 times per day  . levofloxacin  750 mg Oral Q24H  . [START ON 07/01/2014] pneumococcal 23 valent vaccine  0.5 mL Intramuscular Tomorrow-1000    Continuous Infusions: . sodium chloride 125 mL/hr at 06/30/14 [id number]    Past Medical History  Diagnosis Date  . Seizures     History reviewed. No pertinent past surgical history.  Maureen ChattersKatie Faisal Stradling, RD, LDN Pager #: [phone] After-Hours Pager #: [phone]

## 2014-06-30 NOTE — H&P (Signed)
[facility] History and Physical  April Lowe ZOX:[id number] DOB: Feb 07, 1976 DOA: 06/29/2014  Referring physician: EDP PCP: No PCP Per Patient   Chief Complaint: Seizures, N/V   HPI: April Lowe is a 38 y.o. female pmh of epilepsy who presents to the ED with multiple complaints.  First she has had seizures but cannot afford her dilantin and this has resulted in worsening grand-mal seizures.  Neurology was called and recommended that the patient be placed on tegratol instead at this time.  Second the patient reports abdominal pain, N/V (greenish vomit), for the past 2 weeks.  This has been worsening and particularly severe the past couple of days.  She has had what she thinks is a 15 lb weight loss in the past 2 weeks.  Work up in the ED demonstrated an anion gap metabolic acidosis.  Review of Systems: Systems reviewed.  As above, otherwise negative  Past Medical History  Diagnosis Date  . Seizures    History reviewed. No pertinent past surgical history. Social History:  reports that she has been smoking.  She does not have any smokeless tobacco history on file. Her alcohol and drug histories are not on file.  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  Allergies  Allergen Reactions  . Doxycycline Hives  . Toradol [Ketorolac Tromethamine]     Makes her "evil"  . Penicillins Rash    History reviewed. No pertinent family history.   Prior to Admission medications   Medication Sig Start Date End Date Taking? Authorizing Provider  carbamazepine (TEGRETOL) 200 MG tablet Take 1 tablet (200 mg total) by mouth 2 (two) times daily. 06/29/14   Arthor Captain, PA-C  dicyclomine (BENTYL) 20 MG tablet Take 1 tablet (20 mg total) by mouth 2 (two) times daily. 06/29/14   Arthor Captain, PA-C  promethazine (PHENERGAN) 25 MG tablet Take 1 tablet (25 mg total) by mouth every 6 (six) hours as needed for nausea or vomiting.  06/29/14   Arthor Captain, PA-C   Physical Exam: Filed Vitals:   06/29/14 2330  BP: 128/85  Pulse: 96  Temp:   Resp: 22    BP 128/85  Pulse 96  Temp(Src) 98 F (36.7 C) (Oral)  Resp 22  SpO2 99%  LMP 06/16/2014  General Appearance:    Alert, oriented, no distress, appears stated age, patient does smell like paint thinner.  Head:    Normocephalic, atraumatic  Eyes:    PERRL, EOMI, sclera non-icteric        Nose:   Nares without drainage or epistaxis. Mucosa, turbinates normal  Throat:   Moist mucous membranes. Oropharynx without erythema or exudate.  Neck:   Supple. No carotid bruits.  No thyromegaly.  No lymphadenopathy.   Back:     No CVA tenderness, no spinal tenderness  Lungs:     Clear to auscultation bilaterally, without wheezes, rhonchi or rales  Chest wall:    No tenderness to palpitation  Heart:    Regular rate and rhythm without murmurs, gallops, rubs  Abdomen:     Soft, non-tender, nondistended, normal bowel sounds, no organomegaly  Genitalia:    deferred  Rectal:    deferred  Extremities:   No clubbing, cyanosis or edema.  Pulses:   2+ and symmetric all extremities  Skin:   Skin color, texture, turgor normal, no rashes or lesions  Lymph nodes:   Cervical, supraclavicular, and axillary nodes normal  Neurologic:  CNII-XII intact. Normal strength, sensation and reflexes      throughout    Labs on Admission:  Basic Metabolic Panel:  Recent Labs Lab 06/29/14 1750 06/29/14 2331  NA 139 137  K 3.9 3.5*  CL 108 113*  CO2 12*  --   GLUCOSE 121* 122*  BUN 7 8  CREATININE 0.72 0.80  CALCIUM 9.4  --    Liver Function Tests:  Recent Labs Lab 06/29/14 1750  AST 12  ALT 14  ALKPHOS 73  BILITOT <0.2*  PROT 8.3  ALBUMIN 4.3    Recent Labs Lab 06/29/14 1750  LIPASE 34   No results found for this basename: AMMONIA,  in the last 168 hours CBC:  Recent Labs Lab 06/29/14 1750 06/29/14 2331  WBC 8.9  --   NEUTROABS 5.6  --   HGB 15.0 16.7*  HCT  43.0 49.0*  MCV 85.8  --   PLT 308  --    Cardiac Enzymes:  Recent Labs Lab 06/29/14 2340  CKTOTAL 104    BNP (last 3 results) No results found for this basename: PROBNP,  in the last 8760 hours CBG:  Recent Labs Lab 06/29/14 1743  GLUCAP 104*    Radiological Exams on Admission: Ct Abdomen Pelvis W Contrast  06/29/2014   CLINICAL DATA:  Left lower quadrant abdominal pain, nausea and vomiting.  EXAM: CT ABDOMEN AND PELVIS WITH CONTRAST  TECHNIQUE: Multidetector CT imaging of the abdomen and pelvis was performed using the standard protocol following bolus administration of intravenous contrast.  CONTRAST:  OMNIPAQUE IOHEXOL 300 MG/ML  SOLN  COMPARISON:  Abdominal radiograph performed earlier today at 6:14 p.m.  FINDINGS: The visualized lung bases are clear.  The liver and spleen are unremarkable in appearance. The gallbladder is within normal limits. The pancreas and adrenal glands are unremarkable.  A 4 mm nonobstructing stone is noted at the interpole region of the right kidney. The kidneys are otherwise unremarkable in appearance. There is no evidence of hydronephrosis. No obstructing ureteral stones are seen. No perinephric stranding is appreciated.  No free fluid is identified. The small bowel is unremarkable in appearance. The stomach is within normal limits. No acute vascular abnormalities are seen.  The appendix is normal in caliber and contains air, and perhaps small appendicoliths. There is no evidence for appendicitis. The colon is unremarkable in appearance.  The bladder is mildly distended and grossly unremarkable. The uterus is within normal limits. The ovaries are relatively symmetric; no suspicious adnexal masses are seen. No inguinal lymphadenopathy is seen.  No acute osseous abnormalities are identified.  IMPRESSION: 1. No acute abnormality seen within the abdomen or pelvis. 2. 4 mm nonobstructing stone at the interpole region of the right kidney. No evidence of  hydronephrosis.   Electronically Signed   By: Roanna Raider M.D.   On: 06/29/2014 22:38   Dg Abd Acute W/chest  06/29/2014   CLINICAL DATA:  Right and left abdominal pain.  EXAM: ACUTE ABDOMEN SERIES (ABDOMEN 2 VIEW & CHEST 1 VIEW)  COMPARISON:  None.  FINDINGS: Single view of the chest demonstrates clear lungs and normal heart size. No pneumothorax or pleural effusion.  Two views of the abdomen show no free intraperitoneal air. The bowel gas pattern is normal. A punctate calcification in the right upper quadrant may be a nonobstructing right renal stone. No bony abnormality is identified.  IMPRESSION: No acute finding.  Possible punctate nonobstructing right renal stone.   Electronically Signed   By: Maisie Fus  Dalessio M.D.   On: 06/29/2014 18:25    EKG: Independently reviewed.  Assessment/Plan Principal Problem:   High anion gap metabolic acidosis Active Problems:   Nausea and vomiting   Epilepsy   1. High anion gap metabolic acidosis - although rare, unintentional Toluene inhalation toxicity may be a consideration in this patient as other causes of her metabolic acidosis are excluded from the differential diagnosis as follows: 1. Ketoacidosis - remains a possibility checking betahydroxybuterate, patient does smell like acetone but she has no ketones in urine and no reason to have ketoacidosis, specifically she is not diabetic, last consumption of EtOH was 4 years ago. 2. Volatile EtOH consumption - patient denies drinking any unusual liquids, antifreeze, or volatile alcohol intake, and she does not feel suicidal nor give any indication that she is to me on exam. 3. Salicylates - patient did take 2 baby ASA a day but ran out of this medication 2 weeks ago and hasnt been taking any ASA recently, ASA level is negative in the ED. 4. Lactic acid level was normal 5. The patients kidney function appears normal on labs 6. The patient does not use chronic tylenol 7. Would consider ordering urine  hippuric acid level or blood toluene level but it does not appear that we have those available here, so instead will admit, hydrate, and repeat BMP in AM. 2. N/V - likely secondary to #1 above 3. Epilepsy - putting patient on tegretol 200mg  BID as recommended by neurology and holding off on dilantin for now.  Will need levels drawn in near future.    Code Status: Full Code  Family Communication: Family at bedside Disposition Plan: Admit to inpatient.   Time spent: 70 min  GARDNER, JARED M. [facility] Pager [phone]  If 7AM-7PM, please contact the day team taking care of the patient [email or web address] Password [email or web address] 06/30/2014, 1:53 AM

## 2014-06-30 NOTE — Progress Notes (Signed)
Ed operator stated that RN in ED will call back.

## 2014-06-30 NOTE — Progress Notes (Signed)
Pt c/o tingling in all 10 fingers(warm to touch, moves without difficulty, strong grips(, states that yesterday she had tingling in her left leg.

## 2014-06-30 NOTE — Progress Notes (Signed)
  Pt admitted to the unit. Pt is stable, alert and oriented per baseline. Oriented to room, staff, and call bell. Educated to call for any assistance. Bed in lowest position, call bell within reach- will continue to monitor. 

## 2014-07-01 LAB — BASIC METABOLIC PANEL
Anion gap: 15 (ref 5–15)
BUN: 14 mg/dL (ref 6–23)
CO2: 14 mEq/L — ABNORMAL LOW (ref 19–32)
CREATININE: 0.76 mg/dL (ref 0.50–1.10)
Calcium: 8.9 mg/dL (ref 8.4–10.5)
Chloride: 110 mEq/L (ref 96–112)
GFR calc non Af Amer: >90 mL/min (ref >90)
Glucose, Bld: 95 mg/dL (ref 70–99)
Potassium: 3.7 mEq/L (ref 3.7–5.3)
Sodium: 139 mEq/L (ref 137–147)

## 2014-07-01 LAB — CBC
HEMATOCRIT: 38.6 % (ref 36.0–46.0)
Hemoglobin: 13 g/dL (ref 12.0–15.0)
MCH: 29 pg (ref 26.0–34.0)
MCHC: 33.7 g/dL (ref 30.0–36.0)
MCV: 86.2 fL (ref 78.0–100.0)
Platelets: 246 10*3/uL (ref 150–400)
RBC: 4.48 MIL/uL (ref 3.87–5.11)
RDW: 13.8 % (ref 11.5–15.5)
WBC: 5.3 10*3/uL (ref 4.0–10.5)

## 2014-07-01 MED ORDER — KETOROLAC TROMETHAMINE 15 MG/ML IJ SOLN
15.0000 mg | Freq: Four times a day (QID) | INTRAMUSCULAR | Status: DC | PRN
  Administered 2014-07-01 (×2): 15 mg via INTRAVENOUS
  Filled 2014-07-01 (×2): qty 1

## 2014-07-01 MED ORDER — LUBIPROSTONE 24 MCG PO CAPS
24.0000 ug | ORAL_CAPSULE | Freq: Two times a day (BID) | ORAL | Status: DC
  Administered 2014-07-01: 24 ug via ORAL
  Filled 2014-07-01 (×3): qty 1

## 2014-07-01 MED ORDER — BUSPIRONE HCL 5 MG PO TABS
5.0000 mg | ORAL_TABLET | Freq: Two times a day (BID) | ORAL | Status: DC
  Administered 2014-07-01: 5 mg via ORAL
  Filled 2014-07-01 (×2): qty 1

## 2014-07-01 MED ORDER — POLYETHYLENE GLYCOL 3350 17 G PO PACK: 17.0000 g | PACK | Freq: Every day | ORAL | Status: AC

## 2014-07-01 MED ORDER — MAGNESIUM CITRATE PO SOLN
1.0000 | Freq: Once | ORAL | Status: AC
  Administered 2014-07-01: 1 via ORAL
  Filled 2014-07-01: qty 296

## 2014-07-01 MED ORDER — PEG-KCL-NACL-NASULF-NA ASC-C 100 G PO SOLR
1.0000 | ORAL | Status: AC
  Administered 2014-07-01: 200 g via ORAL
  Filled 2014-07-01: qty 1

## 2014-07-01 MED ORDER — SORBITOL 70 % SOLN
960.0000 mL | TOPICAL_OIL | Freq: Once | ORAL | Status: AC
  Administered 2014-07-01: 960 mL via RECTAL
  Filled 2014-07-01: qty 240

## 2014-07-01 NOTE — Progress Notes (Signed)
DC home with girlfriend, verbally understood dc instructions with no questions asked
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
## 2014-07-06 LAB — BETA-HYDROXYBUTYRIC ACID: Beta-Hydroxybutyric Acid: 0.03 mmol/L

## 2014-07-07 NOTE — ED Provider Notes (Signed)
Medical screening examination/treatment/procedure(s) were performed by non-physician practitioner and as supervising physician I was immediately available for consultation/collaboration.   EKG Interpretation   Date/Time:  Thursday June 29 2014 16:34:46 EDT Ventricular Rate:  100 PR Interval:  138 QRS Duration: 83 QT Interval:  345 QTC Calculation: 445 R Axis:   60 Text Interpretation:  Sinus tachycardia ED PHYSICIAN INTERPRETATION  AVAILABLE IN [facility] Confirmed by TEST, Record ([id number]) on  07/01/2014 7:11:23 AM        Rolland PorterMark Ariadna Setter, MD 07/07/14 [phone]

## 2015-06-06 IMAGING — CT CT ABD-PELV W/ CM
2 of 4 series · 13 of 46 positions shown, 15 images · IV contrast (omnipaque)
Comparison: Abdominal radiograph performed earlier today at [DATE]
p.m.

CLINICAL DATA: Left lower quadrant abdominal pain, nausea and
vomiting.

EXAM:
CT ABDOMEN AND PELVIS WITH CONTRAST
TECHNIQUE: Multidetector CT imaging of the abdomen and pelvis was performed
using the standard protocol following bolus administration of
intravenous contrast.
CONTRAST:  100mL OMNIPAQUE IOHEXOL 300 MG/ML  SOLN

[Series 201: routine, idose (2) · axial · 0.68mm/px · z∈[+184,+544]mm · 10 of 88 slices shown, 12 images]
[im 8/88  soft-tissue]
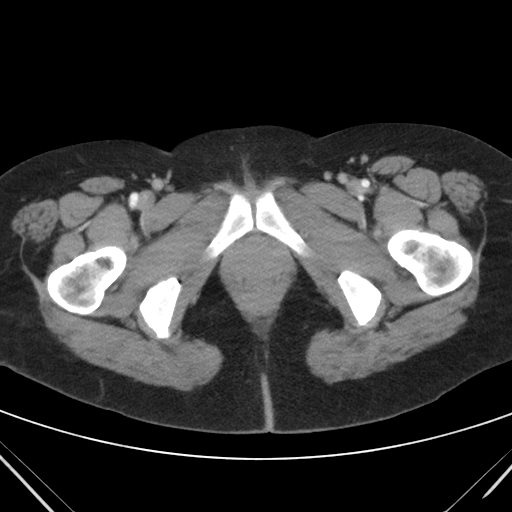
[im 8/88  bone]
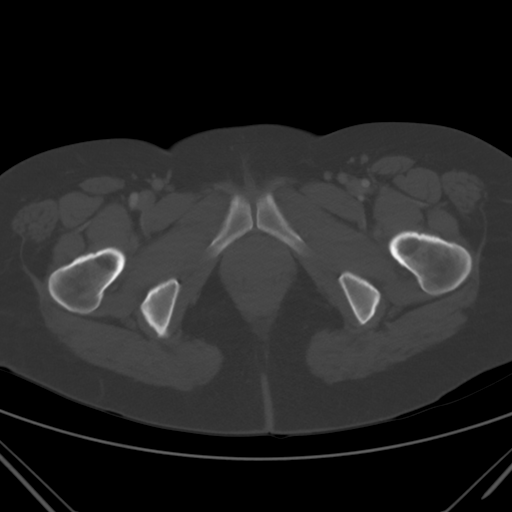
[im 16/88  soft-tissue]
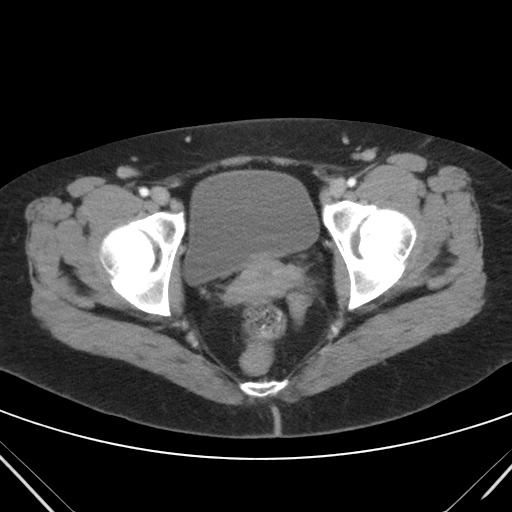
[im 23/88  soft-tissue]
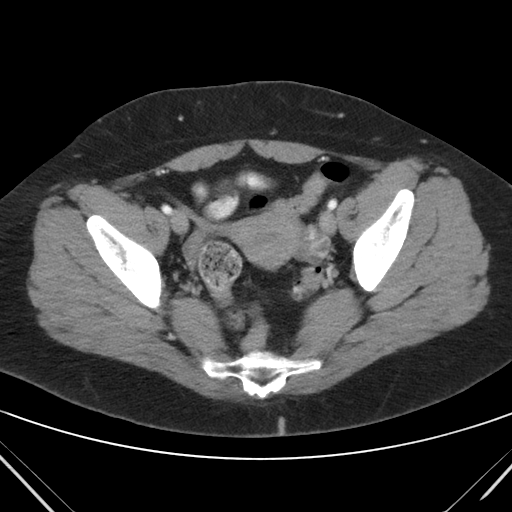
[im 31/88  soft-tissue]
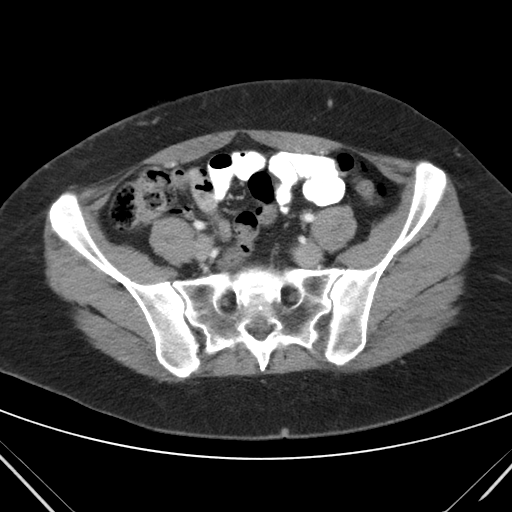
[im 38/88  soft-tissue]
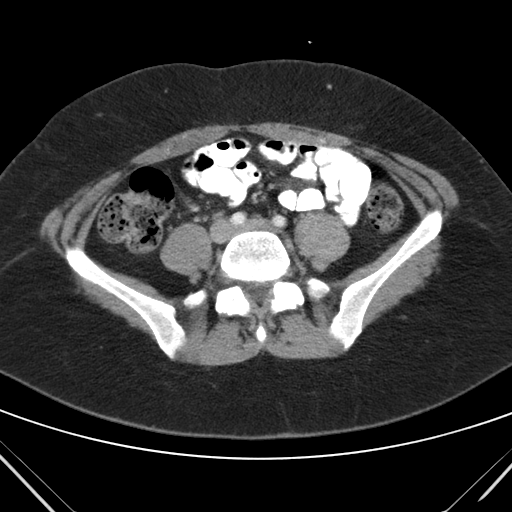
[im 50/88  soft-tissue]
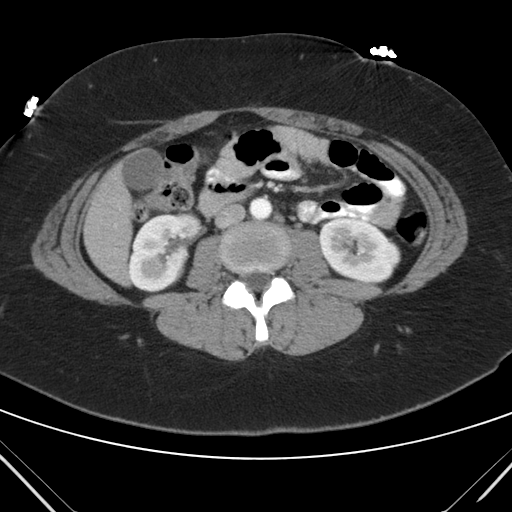
[im 57/88  soft-tissue]
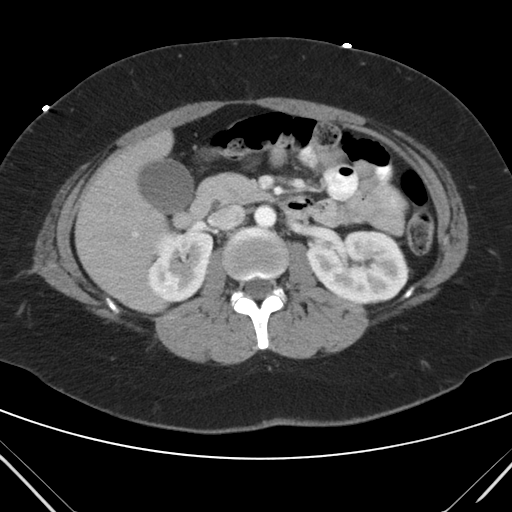
[im 65/88  soft-tissue]
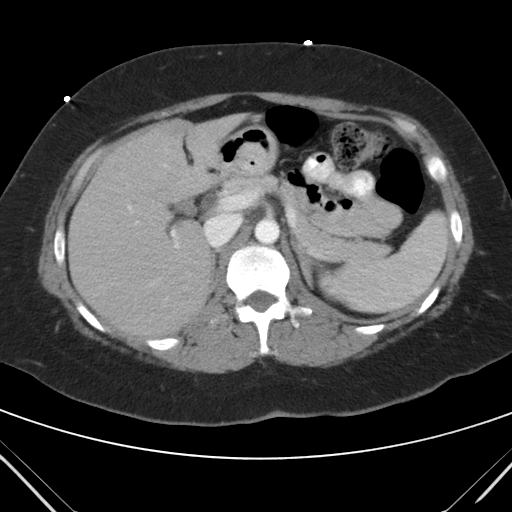
[im 72/88  soft-tissue]
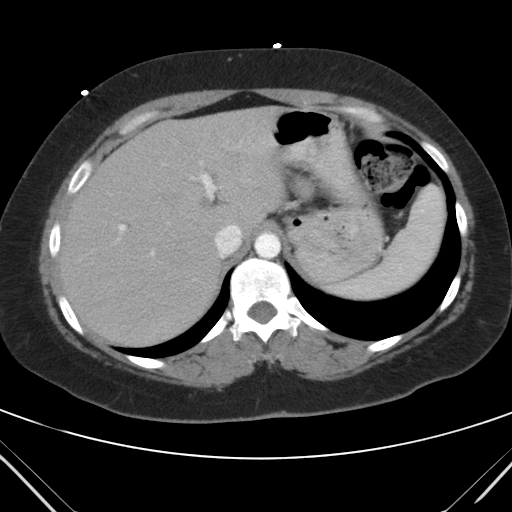
[im 72/88  bone]
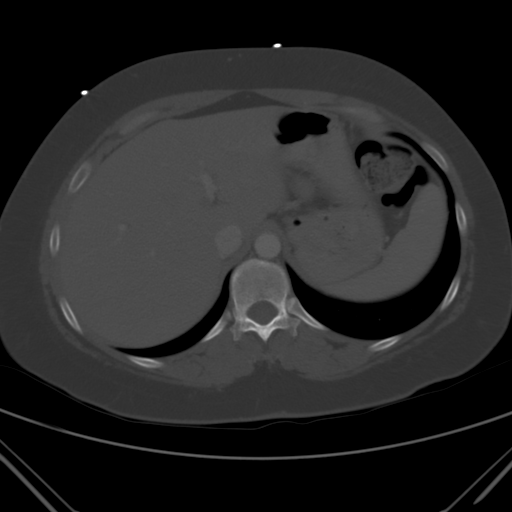
[im 80/88  soft-tissue]
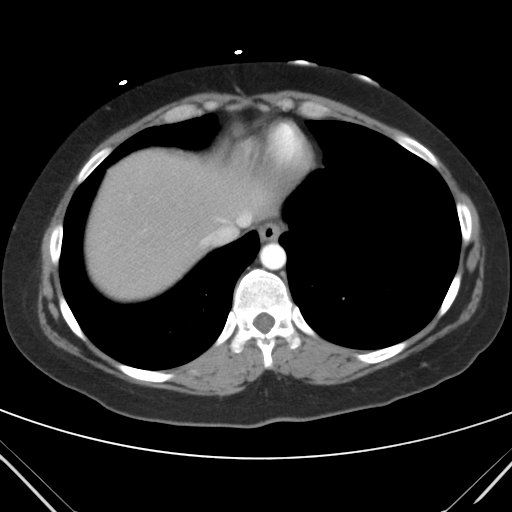

[Series 203: coronals, idose (2) · coronal · 0.50mm/px · 3 of 129 slices shown]
[im 43/129  soft-tissue]
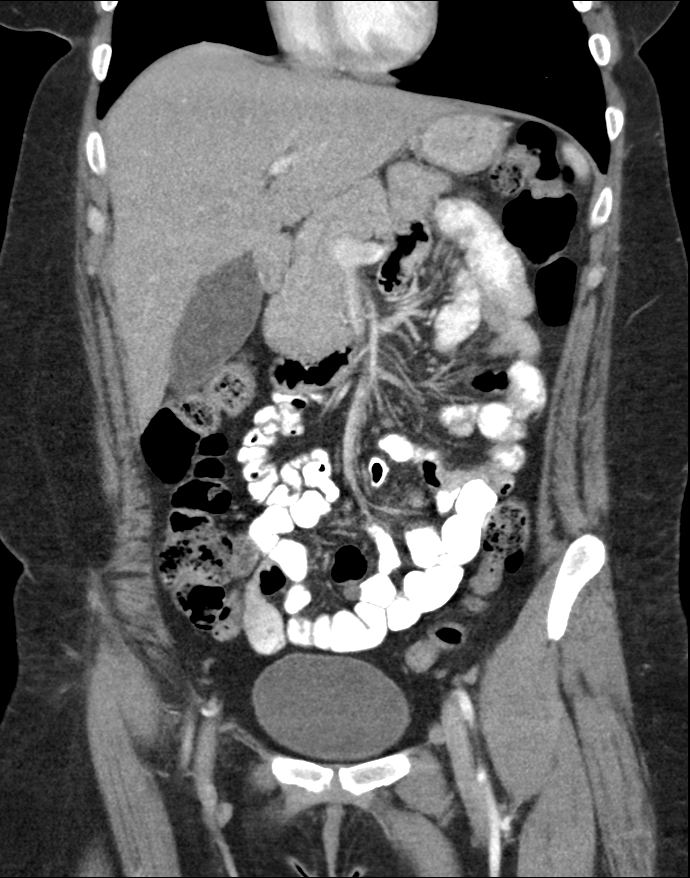
[im 57/129  soft-tissue]
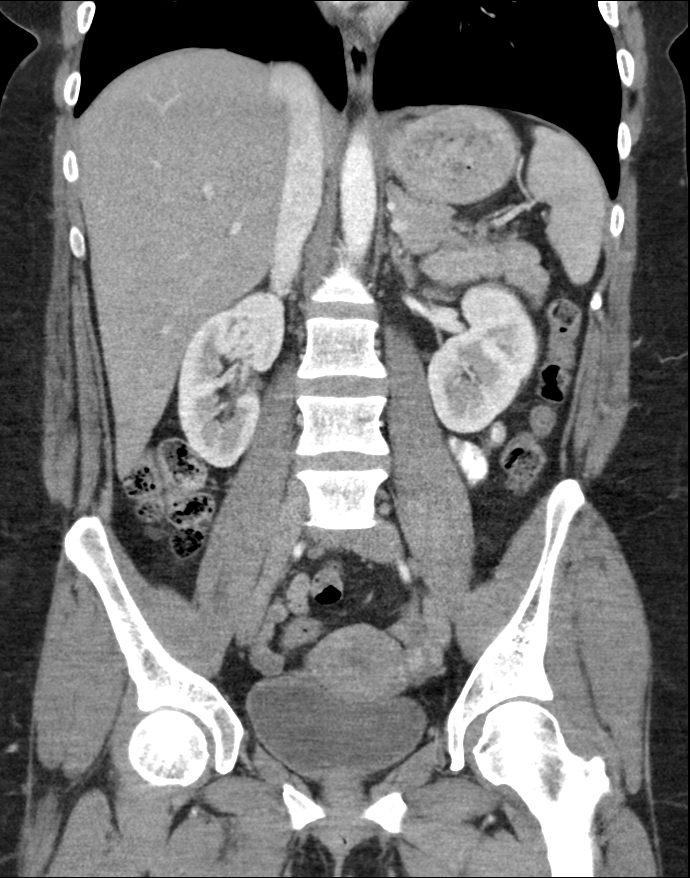
[im 72/129  soft-tissue]
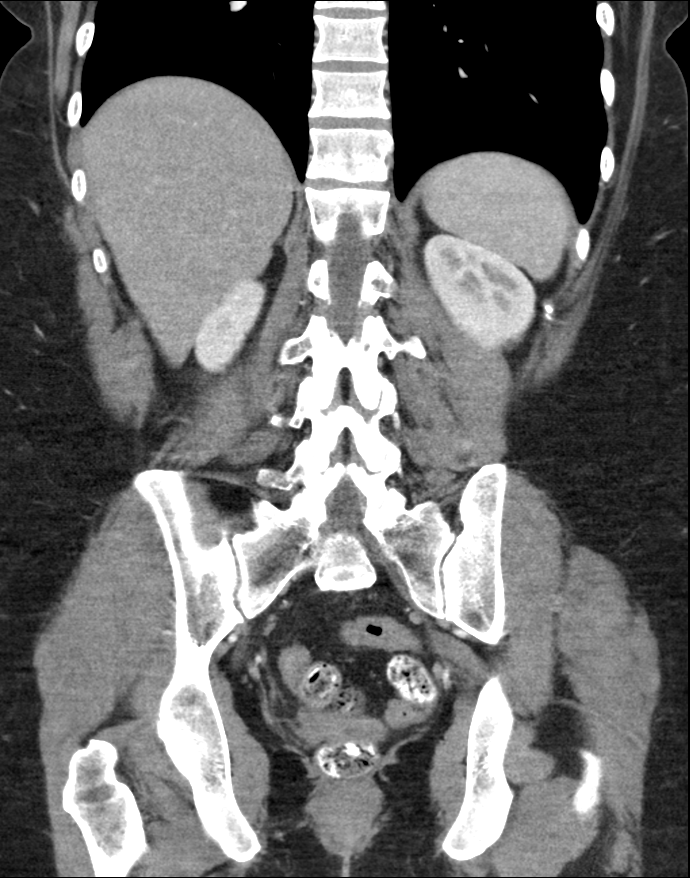

[13 of 46 positions shown; findings below may reference images not displayed]

FINDINGS: The visualized lung bases are clear.

The liver and spleen are unremarkable in appearance. The gallbladder
is within normal limits. The pancreas and adrenal glands are
unremarkable.

A 4 mm nonobstructing stone is noted at the interpole region of the
right kidney. The kidneys are otherwise unremarkable in appearance.
There is no evidence of hydronephrosis. No obstructing ureteral
stones are seen. No perinephric stranding is appreciated.

No free fluid is identified. The small bowel is unremarkable in
appearance. The stomach is within normal limits. No acute vascular
abnormalities are seen.

The appendix is normal in caliber and contains air, and perhaps
small appendicoliths. There is no evidence for appendicitis. The
colon is unremarkable in appearance.

The bladder is mildly distended and grossly unremarkable. The uterus
is within normal limits. The ovaries are relatively symmetric; no
suspicious adnexal masses are seen. No inguinal lymphadenopathy is
seen.

No acute osseous abnormalities are identified.
IMPRESSION: 1. No acute abnormality seen within the abdomen or pelvis.
2. 4 mm nonobstructing stone at the interpole region of the right
kidney. No evidence of hydronephrosis.
